# Patient Record
Sex: Female | Born: 1970 | Race: White | Hispanic: No | Marital: Married | Smoking: Former smoker
Health system: Southern US, Community
[De-identification: ages and names within clinical notes are randomized; demographics above are authoritative.]

## PROBLEM LIST (undated history)

## (undated) DIAGNOSIS — I82409 Acute embolism and thrombosis of unspecified deep veins of unspecified lower extremity: Secondary | ICD-10-CM

## (undated) HISTORY — DX: Acute embolism and thrombosis of unspecified deep veins of unspecified lower extremity: I82.409

---

## 1999-03-16 ENCOUNTER — Other Ambulatory Visit: Admission: RE | Admit: 1999-03-16 | Discharge: 1999-03-16 | Payer: Self-pay | Admitting: *Deleted

## 2000-04-09 ENCOUNTER — Other Ambulatory Visit: Admission: RE | Admit: 2000-04-09 | Discharge: 2000-04-09 | Payer: Self-pay | Admitting: Obstetrics and Gynecology

## 2001-04-07 ENCOUNTER — Other Ambulatory Visit: Admission: RE | Admit: 2001-04-07 | Discharge: 2001-04-07 | Payer: Self-pay | Admitting: Obstetrics and Gynecology

## 2002-02-23 ENCOUNTER — Observation Stay (HOSPITAL_COMMUNITY): Admission: AD | Admit: 2002-02-23 | Discharge: 2002-02-23 | Payer: Self-pay | Admitting: Obstetrics and Gynecology

## 2002-04-07 ENCOUNTER — Other Ambulatory Visit: Admission: RE | Admit: 2002-04-07 | Discharge: 2002-04-07 | Payer: Self-pay | Admitting: Obstetrics and Gynecology

## 2002-07-23 ENCOUNTER — Inpatient Hospital Stay (HOSPITAL_COMMUNITY): Admission: AD | Admit: 2002-07-23 | Discharge: 2002-07-25 | Payer: Self-pay | Admitting: Obstetrics and Gynecology

## 2002-08-19 ENCOUNTER — Encounter: Admission: RE | Admit: 2002-08-19 | Discharge: 2002-09-18 | Payer: Self-pay | Admitting: *Deleted

## 2004-11-23 ENCOUNTER — Other Ambulatory Visit: Admission: RE | Admit: 2004-11-23 | Discharge: 2004-11-23 | Payer: Self-pay | Admitting: Obstetrics and Gynecology

## 2009-11-14 ENCOUNTER — Ambulatory Visit (HOSPITAL_COMMUNITY): Admission: RE | Admit: 2009-11-14 | Discharge: 2009-11-14 | Payer: Self-pay | Admitting: Obstetrics and Gynecology

## 2010-10-27 NOTE — H&P (Signed)
Deborah Burton, Deborah Burton NO.:  [id number]   MEDICAL RECORD NO.:  [id number]                   PATIENT TYPE:  INP   LOCATION:  9174                                 FACILITY:  [facility]   PHYSICIAN:  Janine Limbo, M.D.            DATE OF BIRTH:  [birth year]   DATE OF ADMISSION:  07/23/2002  DATE OF DISCHARGE:                                HISTORY & PHYSICAL   HISTORY OF PRESENT ILLNESS:  This is a 40 year old, gravida 3, para 1-0-1-1  at 38-6/7 weeks who presents to the office in active labor.  She reports  contractions every five minutes all morning.  Denies leaking.  Reports small  amount of bloody show and positive fetal movement.  Pregnancy has been  followed by the nurse midwife service and remarkable for first trimester  bleeding and group B strep negative.   PRENATAL LABS:  Hemoglobin 12.5, platelets 187, blood type B positive,  antibody screen negative, RPR nonreactive, rubella immune, HBSAG negative,  HIV declined, Pap smear normal, gonorrhea negative, Chlamydia negative,  cystic fibrosis declined.  Second trimester - RPR nonreactive, AFP free beta  within normal limits.  Glucose tolerance within normal limits.  Third  trimester - group B strep negative, gonorrhea and Chlamydia negative.   OB HISTORY:  Remarkable for vaginal delivery in 1999 of a female infant at  [redacted] weeks gestation, weighing 6 pounds 9 ounces.  [place]  She was induced for post dates and there were no  complications.   PAST MEDICAL HISTORY:  Remarkable for history of anemia in prior pregnancy,  history of childhood varicella.   PAST SURGICAL HISTORY:  Remarkable for tonsillectomy and wisdom teeth  extraction.   FAMILY HISTORY:  Remarkable for mother with thrombophlebitis.  Paternal  grandfather with prostate cancer and a father with skin cancer.  Father with  nicotine addiction.  Genetic history is unremarkable.   SOCIAL HISTORY:  The  patient is married to [employer] who is involved and  supportive.  She just not report a religious affiliation.  Denies any  alcohol, tobacco or drug use.   PHYSICAL EXAMINATION:  VITAL SIGNS:  Stable, afebrile.  HEENT:  Within normal limits.  Thyroid normal, not enlarged.  CHEST:  Clear to auscultation.  HEART:  Regular rate and rhythm.  ABDOMEN:  Gravid at 37 cm, fetal heart rate 150. Cervical exam 4-5 cm, 90%  effaced, -1 station with bulging membranes.  EXTREMITIES:  Within normal limits.   ASSESSMENT:  1. Intrauterine pregnancy at 38-6/7 weeks.  2. Active labor.  3. Group B strep negative.    PLAN:  1. Admit to birthing suite.  ________ as notified.  2. Routine CNM orders.  3. Anticipate SVD.     Marie L. Williams, C.N.M.                 Janine Limbo, M.D.  MLW/MEDQ  D:  07/23/2002  T:  07/23/2002  Job:  [id number]

## 2014-08-10 DIAGNOSIS — S0101XA Laceration without foreign body of scalp, initial encounter: Secondary | ICD-10-CM | POA: Diagnosis not present

## 2014-08-10 DIAGNOSIS — Z3202 Encounter for pregnancy test, result negative: Secondary | ICD-10-CM | POA: Insufficient documentation

## 2014-08-10 DIAGNOSIS — W01198A Fall on same level from slipping, tripping and stumbling with subsequent striking against other object, initial encounter: Secondary | ICD-10-CM | POA: Insufficient documentation

## 2014-08-10 DIAGNOSIS — Z23 Encounter for immunization: Secondary | ICD-10-CM | POA: Insufficient documentation

## 2014-08-10 DIAGNOSIS — Y998 Other external cause status: Secondary | ICD-10-CM | POA: Diagnosis not present

## 2014-08-10 DIAGNOSIS — Y939 Activity, unspecified: Secondary | ICD-10-CM | POA: Diagnosis not present

## 2014-08-10 DIAGNOSIS — Y929 Unspecified place or not applicable: Secondary | ICD-10-CM | POA: Insufficient documentation

## 2014-08-10 DIAGNOSIS — R55 Syncope and collapse: Secondary | ICD-10-CM | POA: Diagnosis not present

## 2014-08-11 ENCOUNTER — Other Ambulatory Visit: Payer: Self-pay

## 2014-08-11 ENCOUNTER — Encounter (HOSPITAL_COMMUNITY): Payer: Self-pay | Admitting: Emergency Medicine

## 2014-08-11 ENCOUNTER — Emergency Department (HOSPITAL_COMMUNITY)
Admission: EM | Admit: 2014-08-11 | Discharge: 2014-08-11 | Disposition: A | Discharge status: Home / Self Care | Payer: BC Managed Care – PPO | Attending: Emergency Medicine | Admitting: Emergency Medicine

## 2014-08-11 ENCOUNTER — Emergency Department (HOSPITAL_COMMUNITY): Payer: BC Managed Care – PPO

## 2014-08-11 DIAGNOSIS — S0101XA Laceration without foreign body of scalp, initial encounter: Secondary | ICD-10-CM

## 2014-08-11 DIAGNOSIS — R55 Syncope and collapse: Secondary | ICD-10-CM

## 2014-08-11 LAB — PREGNANCY, URINE: Preg Test, Ur: NEGATIVE

## 2014-08-11 LAB — BASIC METABOLIC PANEL
Anion gap: 5 (ref 5–15)
BUN: 13 mg/dL (ref 6–23)
CALCIUM: 8.6 mg/dL (ref 8.4–10.5)
CO2: 26 mmol/L (ref 19–32)
CREATININE: 0.81 mg/dL (ref 0.50–1.10)
Chloride: 103 mmol/L (ref 96–112)
GFR calc Af Amer: >90 mL/min (ref >90)
GFR, EST NON AFRICAN AMERICAN: 86 mL/min — AB (ref >90)
GLUCOSE: 115 mg/dL — AB (ref 70–99)
POTASSIUM: 3.7 mmol/L (ref 3.5–5.1)
Sodium: 134 mmol/L — ABNORMAL LOW (ref 135–145)

## 2014-08-11 LAB — CBC WITH DIFFERENTIAL/PLATELET
BASOS ABS: 0 10*3/uL (ref 0.0–0.1)
BASOS PCT: 0 % (ref 0–1)
Eosinophils Absolute: 0.1 10*3/uL (ref 0.0–0.7)
Eosinophils Relative: 1 % (ref 0–5)
HCT: 36.9 % (ref 36.0–46.0)
HEMOGLOBIN: 12.6 g/dL (ref 12.0–15.0)
LYMPHS PCT: 10 % — AB (ref 12–46)
Lymphs Abs: 1.2 10*3/uL (ref 0.7–4.0)
MCH: 30.2 pg (ref 26.0–34.0)
MCHC: 34.1 g/dL (ref 30.0–36.0)
MCV: 88.5 fL (ref 78.0–100.0)
MONOS PCT: 6 % (ref 3–12)
Monocytes Absolute: 0.7 10*3/uL (ref 0.1–1.0)
NEUTROS ABS: 9.6 10*3/uL — AB (ref 1.7–7.7)
NEUTROS PCT: 83 % — AB (ref 43–77)
Platelets: 204 10*3/uL (ref 150–400)
RBC: 4.17 MIL/uL (ref 3.87–5.11)
RDW: 13.6 % (ref 11.5–15.5)
WBC: 11.6 10*3/uL — AB (ref 4.0–10.5)

## 2014-08-11 LAB — URINALYSIS, ROUTINE W REFLEX MICROSCOPIC
Bilirubin Urine: NEGATIVE
Glucose, UA: NEGATIVE mg/dL
Hgb urine dipstick: NEGATIVE
KETONES UR: NEGATIVE mg/dL
LEUKOCYTES UA: NEGATIVE
NITRITE: NEGATIVE
PROTEIN: NEGATIVE mg/dL
Specific Gravity, Urine: 1.013 (ref 1.005–1.030)
UROBILINOGEN UA: 0.2 mg/dL (ref 0.0–1.0)
pH: 5.5 (ref 5.0–8.0)

## 2014-08-11 MED ORDER — LIDOCAINE-EPINEPHRINE (PF) 2 %-1:200000 IJ SOLN
10.0000 mL | Freq: Once | INTRAMUSCULAR | Status: AC
  Administered 2014-08-11: 10 mL
  Filled 2014-08-11: qty 20

## 2014-08-11 MED ORDER — TETANUS-DIPHTH-ACELL PERTUSSIS 5-2.5-18.5 LF-MCG/0.5 IM SUSP
0.5000 mL | Freq: Once | INTRAMUSCULAR | Status: AC
  Administered 2014-08-11: 0.5 mL via INTRAMUSCULAR
  Filled 2014-08-11: qty 0.5

## 2014-08-11 NOTE — ED Notes (Signed)
Per Dr Sharman Cheektter's verbal order, do not insert IV at this time.

## 2014-08-11 NOTE — Discharge Instructions (Signed)
Staples can be removed in one week.  This can be done in the emergency department, urgent care or at your primary care doctor's office.  Your workup was otherwise unremarkable.  You received tetanus update today.   Laceration Care, Adult A laceration is a cut or lesion that goes through all layers of the skin and into the tissue just beneath the skin. TREATMENT  Some lacerations may not require closure. Some lacerations may not be able to be closed due to an increased risk of infection. It is important to see your caregiver as soon as possible after an injury to minimize the risk of infection and maximize the opportunity for successful closure. If closure is appropriate, pain medicines may be given, if needed. The wound will be cleaned to help prevent infection. Your caregiver will use stitches (sutures), staples, wound glue (adhesive), or skin adhesive strips to repair the laceration. These tools bring the skin edges together to allow for faster healing and a better cosmetic outcome. However, all wounds will heal with a scar. Once the wound has healed, scarring can be minimized by covering the wound with sunscreen during the day for 1 full year. HOME CARE INSTRUCTIONS  For sutures or staples:  Keep the wound clean and dry.  If you were given a bandage (dressing), you should change it at least once a day. Also, change the dressing if it becomes wet or dirty, or as directed by your caregiver.  Wash the wound with soap and water 2 times a day. Rinse the wound off with water to remove all soap. Pat the wound dry with a clean towel.  After cleaning, apply a thin layer of the antibiotic ointment as recommended by your caregiver. This will help prevent infection and keep the dressing from sticking.  You may shower as usual after the first 24 hours. Do not soak the wound in water until the sutures are removed.  Only take over-the-counter or prescription medicines for pain, discomfort, or fever as  directed by your caregiver.  Get your sutures or staples removed as directed by your caregiver. For skin adhesive strips:  Keep the wound clean and dry.  Do not get the skin adhesive strips wet. You may bathe carefully, using caution to keep the wound dry.  If the wound gets wet, pat it dry with a clean towel.  Skin adhesive strips will fall off on their own. You may trim the strips as the wound heals. Do not remove skin adhesive strips that are still stuck to the wound. They will fall off in time. For wound adhesive:  You may briefly wet your wound in the shower or bath. Do not soak or scrub the wound. Do not swim. Avoid periods of heavy perspiration until the skin adhesive has fallen off on its own. After showering or bathing, gently pat the wound dry with a clean towel.  Do not apply liquid medicine, cream medicine, or ointment medicine to your wound while the skin adhesive is in place. This may loosen the film before your wound is healed.  If a dressing is placed over the wound, be careful not to apply tape directly over the skin adhesive. This may cause the adhesive to be pulled off before the wound is healed.  Avoid prolonged exposure to sunlight or tanning lamps while the skin adhesive is in place. Exposure to ultraviolet light in the first year will darken the scar.  The skin adhesive will usually remain in place for 5 to 10  days, then naturally fall off the skin. Do not pick at the adhesive film. You may need a tetanus shot if:  You cannot remember when you had your last tetanus shot.  You have never had a tetanus shot. If you get a tetanus shot, your arm may swell, get red, and feel warm to the touch. This is common and not a problem. If you need a tetanus shot and you choose not to have one, there is a rare chance of getting tetanus. Sickness from tetanus can be serious. SEEK MEDICAL CARE IF:   You have redness, swelling, or increasing pain in the wound.  You see a red  line that goes away from the wound.  You have yellowish-white fluid (pus) coming from the wound.  You have a fever.  You notice a bad smell coming from the wound or dressing.  Your wound breaks open before or after sutures have been removed.  You notice something coming out of the wound such as wood or glass.  Your wound is on your hand or foot and you cannot move a finger or toe. SEEK IMMEDIATE MEDICAL CARE IF:   Your pain is not controlled with prescribed medicine.  You have severe swelling around the wound causing pain and numbness or a change in color in your arm, hand, leg, or foot.  Your wound splits open and starts bleeding.  You have worsening numbness, weakness, or loss of function of any joint around or beyond the wound.  You develop painful lumps near the wound or on the skin anywhere on your body. MAKE SURE YOU:   Understand these instructions.  Will watch your condition.  Will get help right away if you are not doing well or get worse. Document Released: 05/28/2005 Document Revised: 08/20/2011 Document Reviewed: 11/21/2010 [facility] Patient Information 2015 [place], [place]. This information is not intended to replace advice given to you by your health care provider. Make sure you discuss any questions you have with your health care provider.  Stitches, Staples, or Skin Adhesive Strips  Stitches (sutures), staples, and skin adhesive strips hold the skin together as it heals. They will usually be in place for 7 days or less. HOME CARE  Wash your hands with soap and water before and after you touch your wound.  Only take medicine as told by your doctor.  Cover your wound only if your doctor told you to. Otherwise, leave it open to air.  Do not get your stitches wet or dirty. If they get dirty, dab them gently with a clean washcloth. Wet the washcloth with soapy water. Do not rub. Pat them dry gently.  Do not put medicine or medicated cream on your stitches  unless your doctor told you to.  Do not take out your own stitches or staples. Skin adhesive strips will fall off by themselves.  Do not pick at the wound. Picking can cause an infection.  Do not miss your follow-up appointment.  If you have problems or questions, call your doctor. GET HELP RIGHT AWAY IF:   You have a temperature by mouth above 102 F (38.9 C), not controlled by medicine.  You have chills.  You have redness or pain around your stitches.  There is puffiness (swelling) around your stitches.  You notice fluid (drainage) from your stitches.  There is a bad smell coming from your wound. MAKE SURE YOU:  Understand these instructions.  Will watch your condition.  Will get help if you are not doing  well or get worse. Document Released: 03/25/2009 Document Revised: 08/20/2011 Document Reviewed: 03/25/2009 [facility] Patient Information 2015 [place]. This information is not intended to replace advice given to you by your health care provider. Make sure you discuss any questions you have with your health care provider.  Syncope Syncope is a medical term for fainting or passing out. This means you lose consciousness and drop to the ground. People are generally unconscious for less than 5 minutes. You may have some muscle twitches for up to 15 seconds before waking up and returning to normal. Syncope occurs more often in older adults, but it can happen to anyone. While most causes of syncope are not dangerous, syncope can be a sign of a serious medical problem. It is important to seek medical care.  CAUSES  Syncope is caused by a sudden drop in blood flow to the brain. The specific cause is often not determined. Factors that can bring on syncope include:  Taking medicines that lower blood pressure.  Sudden changes in posture, such as standing up quickly.  Taking more medicine than prescribed.  Standing in one place for too long.  Seizure  disorders.  Dehydration and excessive exposure to heat.  Low blood sugar (hypoglycemia).  Straining to have a bowel movement.  Heart disease, irregular heartbeat, or other circulatory problems.  Fear, emotional distress, seeing blood, or severe pain. SYMPTOMS  Right before fainting, you may:  Feel dizzy or light-headed.  Feel nauseous.  See all white or all black in your field of vision.  Have cold, clammy skin. DIAGNOSIS  Your health care provider will ask about your symptoms, perform a physical exam, and perform an electrocardiogram (ECG) to record the electrical activity of your heart. Your health care provider may also perform other heart or blood tests to determine the cause of your syncope which may include:  Transthoracic echocardiogram (TTE). During echocardiography, sound waves are used to evaluate how blood flows through your heart.  Transesophageal echocardiogram (TEE).  Cardiac monitoring. This allows your health care provider to monitor your heart rate and rhythm in real time.  Holter monitor. This is a portable device that records your heartbeat and can help diagnose heart arrhythmias. It allows your health care provider to track your heart activity for several days, if needed.  Stress tests by exercise or by giving medicine that makes the heart beat faster. TREATMENT  In most cases, no treatment is needed. Depending on the cause of your syncope, your health care provider may recommend changing or stopping some of your medicines. HOME CARE INSTRUCTIONS  Have someone stay with you until you feel stable.  Do not drive, use machinery, or play sports until your health care provider says it is okay.  Keep all follow-up appointments as directed by your health care provider.  Lie down right away if you start feeling like you might faint. Breathe deeply and steadily. Wait until all the symptoms have passed.  Drink enough fluids to keep your urine clear or pale  yellow.  If you are taking blood pressure or heart medicine, get up slowly and take several minutes to sit and then stand. This can reduce dizziness. SEEK IMMEDIATE MEDICAL CARE IF:   You have a severe headache.  You have unusual pain in the chest, abdomen, or back.  You are bleeding from your mouth or rectum, or you have black or tarry stool.  You have an irregular or very fast heartbeat.  You have pain with breathing.  You  have repeated fainting or seizure-like jerking during an episode.  You faint when sitting or lying down.  You have confusion.  You have trouble walking.  You have severe weakness.  You have vision problems. If you fainted, call your local emergency services (911 in [place] Do not drive yourself to the hospital.  MAKE SURE YOU:  Understand these instructions.  Will watch your condition.  Will get help right away if you are not doing well or get worse. Document Released: 05/28/2005 Document Revised: 06/02/2013 Document Reviewed: 07/27/2011 [facility] Patient Information 2015 [place], [place]. This information is not intended to replace advice given to you by your health care provider. Make sure you discuss any questions you have with your health care provider.

## 2014-08-11 NOTE — ED Notes (Signed)
Suture cart at bedside along with stapler

## 2014-08-11 NOTE — ED Notes (Signed)
Pt st's she had a syncopal episode and fell hitting her head on fireplace.  Pt has lac to back of head, bleeding controlled at this time.  Pt also c/o pain in left shoulder and left ankle.  Pt alert and oriented x's 3 at this time.  Also c/o pain in neck.  C-collar applied in triage

## 2014-08-11 NOTE — ED Provider Notes (Signed)
CSN: [id number]     Arrival date & time 08/10/14  2339 History  This chart was scribed for Olivia Mackie, MD by Annye Asa, ED Scribe. This patient was seen in room A04C/A04C and the patient's care was started at 1:26 AM.    Chief Complaint  Patient presents with  . Head Laceration   Patient is a 44 y.o. female presenting with scalp laceration. The history is provided by the patient. No language interpreter was used.  Head Laceration     HPI Comments: Deborah Burton is an otherwise healthy 44 y.o. female who presents to the Emergency Department complaining of head laceration after a syncopal episode and fall just PTA tonight. Patient explains that she woke from sleep on the couch and stood to travel to her bedroom; she accidentally "rolled" her left ankle and fell to the floor. When she tried to stand to continue to her bedroom, she momentarily lost consciousness, falling and striking her head against the fireplace. No one heard her fall; both her children were asleep. She does note prior experience with similar syncopal episodes.The mild discomfort in her left ankle, left shoulder, and neck has resolved at this time.   Currently menstruating. Patient is a nonsmoker, occasional EtOH user.   History reviewed. No pertinent past medical history. History reviewed. No pertinent past surgical history. No family history on file. History  Substance Use Topics  . Smoking status: Never Smoker   . Smokeless tobacco: Not on file  . Alcohol Use: Yes     Comment: rare   OB History    No data available     Review of Systems  Skin: Positive for wound.  Neurological: Positive for syncope.  All other systems reviewed and are negative.  Allergies  Review of patient's allergies indicates not on file.  Home Medications   Prior to Admission medications   Not on File   BP 118/78 mmHg  Pulse 87  Temp(Src) 98.3 F (36.8 C) (Oral)  Resp 20  Ht  (1.6 m)  Wt 145 lb (65.772 kg)  BMI  25.69 kg/m2  SpO2 100%  LMP 08/10/2014 Physical Exam  Constitutional: She is oriented to person, place, and time. She appears well-developed and well-nourished. No distress.  HENT:  Head: Normocephalic and atraumatic.  Right Ear: External ear normal.  Left Ear: External ear normal.  Nose: Nose normal.  Mouth/Throat: Oropharynx is clear and moist. No oropharyngeal exudate.  Eyes: EOM are normal. Pupils are equal, round, and reactive to light.  Neck: Normal range of motion. Neck supple.  Cardiovascular: Normal rate, regular rhythm, normal heart sounds and intact distal pulses.  Exam reveals no gallop and no friction rub.   No murmur heard. Pulmonary/Chest: Effort normal and breath sounds normal. No respiratory distress. She has no wheezes. She has no rales. She exhibits no tenderness.  Abdominal: Soft. There is no tenderness. There is no rebound and no guarding.  Musculoskeletal: Normal range of motion. She exhibits no edema.  Neurological: She is alert and oriented to person, place, and time. She displays normal reflexes. No cranial nerve deficit. She exhibits normal muscle tone. Coordination normal.  Skin: Skin is warm and dry. No rash noted. No pallor.  3cm linear laceration to occiput; bleeding controlled  Psychiatric: She has a normal mood and affect. Her behavior is normal. Thought content normal.  Nursing note and vitals reviewed.  ED Course  Procedures   DIAGNOSTIC STUDIES: Oxygen Saturation is 100% on RA, normal by my  interpretation.    COORDINATION OF CARE: 1:29 AM Discussed treatment plan with pt at bedside and pt agreed to plan.   Labs Review Labs Reviewed  BASIC METABOLIC PANEL - Abnormal; Notable for the following:    Sodium 134 (*)    Glucose, Bld 115 (*)    GFR calc non Af Amer 86 (*)    All other components within normal limits  CBC WITH DIFFERENTIAL/PLATELET - Abnormal; Notable for the following:    WBC 11.6 (*)    Neutrophils Relative % 83 (*)    Neutro  Abs 9.6 (*)    Lymphocytes Relative 10 (*)    All other components within normal limits  URINALYSIS, ROUTINE W REFLEX MICROSCOPIC  PREGNANCY, URINE    Imaging Review Ct Head Wo Contrast  08/11/2014   CLINICAL DATA:  Multiple syncopal episodes. Hit back of head on fireplace in the living room. Laceration at the back of the head. Posterior neck pain and soreness. Initial encounter.  EXAM: CT HEAD WITHOUT CONTRAST  CT CERVICAL SPINE WITHOUT CONTRAST  TECHNIQUE: Multidetector CT imaging of the head and cervical spine was performed following the standard protocol without intravenous contrast. Multiplanar CT image reconstructions of the cervical spine were also generated.  COMPARISON:  None.  FINDINGS: CT HEAD FINDINGS  There is no evidence of acute infarction, mass lesion, or intra- or extra-axial hemorrhage on CT.  The posterior fossa, including the cerebellum, brainstem and fourth ventricle, is within normal limits. The third and lateral ventricles, and basal ganglia are unremarkable in appearance. The cerebral hemispheres are symmetric in appearance, with normal gray-white differentiation. No mass effect or midline shift is seen.  There is no evidence of fracture; visualized osseous structures are unremarkable in appearance. The visualized portions of the orbits are within normal limits. The paranasal sinuses and mastoid air cells are well-aerated. No significant soft tissue abnormalities are seen.  CT CERVICAL SPINE FINDINGS  There is no evidence of fracture or subluxation. Vertebral bodies demonstrate normal height and alignment. There is mild disc space narrowing at C5-C6, with a small posterior disc osteophyte complex. Prevertebral soft tissues are within normal limits.  The thyroid gland is unremarkable in appearance. The visualized lung apices are clear. No significant soft tissue abnormalities are seen.  IMPRESSION: 1. No evidence of traumatic intracranial injury or fracture. 2. No evidence of fracture  or subluxation along the cervical spine. 3. Minimal degenerative change at the lower cervical spine.   Electronically Signed   By: Roanna Raider M.D.   On: 08/11/2014 03:02   Ct Cervical Spine Wo Contrast  08/11/2014   CLINICAL DATA:  Multiple syncopal episodes. Hit back of head on fireplace in the living room. Laceration at the back of the head. Posterior neck pain and soreness. Initial encounter.  EXAM: CT HEAD WITHOUT CONTRAST  CT CERVICAL SPINE WITHOUT CONTRAST  TECHNIQUE: Multidetector CT imaging of the head and cervical spine was performed following the standard protocol without intravenous contrast. Multiplanar CT image reconstructions of the cervical spine were also generated.  COMPARISON:  None.  FINDINGS: CT HEAD FINDINGS  There is no evidence of acute infarction, mass lesion, or intra- or extra-axial hemorrhage on CT.  The posterior fossa, including the cerebellum, brainstem and fourth ventricle, is within normal limits. The third and lateral ventricles, and basal ganglia are unremarkable in appearance. The cerebral hemispheres are symmetric in appearance, with normal gray-white differentiation. No mass effect or midline shift is seen.  There is no evidence of fracture; visualized  osseous structures are unremarkable in appearance. The visualized portions of the orbits are within normal limits. The paranasal sinuses and mastoid air cells are well-aerated. No significant soft tissue abnormalities are seen.  CT CERVICAL SPINE FINDINGS  There is no evidence of fracture or subluxation. Vertebral bodies demonstrate normal height and alignment. There is mild disc space narrowing at C5-C6, with a small posterior disc osteophyte complex. Prevertebral soft tissues are within normal limits.  The thyroid gland is unremarkable in appearance. The visualized lung apices are clear. No significant soft tissue abnormalities are seen.  IMPRESSION: 1. No evidence of traumatic intracranial injury or fracture. 2. No  evidence of fracture or subluxation along the cervical spine. 3. Minimal degenerative change at the lower cervical spine.   Electronically Signed   By: Roanna RaiderJeffery  Chang M.D.   On: 08/11/2014 03:02     EKG Interpretation   Date/Time:  Wednesday August 11 2014 00:15:33 EST Ventricular Rate:  78 PR Interval:  142 QRS Duration: 82 QT Interval:  394 QTC Calculation: 449 R Axis:   57 Text Interpretation:  Normal sinus rhythm Normal ECG Confirmed by Janai Brannigan   MD, Siriyah Ambrosius ([id number]) on 08/11/2014 12:32:20 AM     LACERATION REPAIR Performed by: Olivia MackieTTER,Jesalyn Finazzo M Authorized by: Olivia MackieTTER,Tate Zagal M Consent: Verbal consent obtained. Risks and benefits: risks, benefits and alternatives were discussed Consent given by: patient Patient identity confirmed: provided demographic data Prepped and Draped in normal sterile fashion Wound explored  Laceration Location: posterior scalp  Laceration Length: 3cm  No Foreign Bodies seen or palpated  Anesthesia: local infiltration  Local anesthetic: lidocaine 2% with epinephrine  Anesthetic total: 3 ml  Irrigation method: syringe Amount of cleaning: standard  Skin closure: skin staples  Number of sutures: 4  Technique: surgical staples  Patient tolerance: Patient tolerated the procedure well with no immediate complications.  MDM   Final diagnoses:  Syncope, unspecified syncope type  Scalp laceration, initial encounter   44 year old female with syncope, fall, with scalp laceration.  Normal exam.  She reports history of syncope associated with pain going back to high school.  Plan for head and C-spine CT scan.  EKG normal.  Labs pending.  I personally performed the services described in this documentation, which was scribed in my presence. The recorded information has been reviewed and is accurate.  Workup here unremarkable.  Laceration repaired.  Patient stable for discharge home      Olivia Mackielga M Abdon Petrosky, MD 08/11/14 [phone]

## 2015-02-09 ENCOUNTER — Other Ambulatory Visit: Payer: Self-pay | Admitting: Obstetrics and Gynecology

## 2015-02-09 DIAGNOSIS — R928 Other abnormal and inconclusive findings on diagnostic imaging of breast: Secondary | ICD-10-CM

## 2019-04-01 HISTORY — PX: KNEE ARTHROSCOPY: SUR90

## 2019-04-07 ENCOUNTER — Other Ambulatory Visit: Payer: Self-pay

## 2019-04-07 ENCOUNTER — Other Ambulatory Visit (HOSPITAL_COMMUNITY): Payer: Self-pay | Admitting: Orthopedic Surgery

## 2019-04-07 ENCOUNTER — Ambulatory Visit (HOSPITAL_COMMUNITY)
Admission: RE | Admit: 2019-04-07 | Discharge: 2019-04-07 | Disposition: A | Discharge status: Home / Self Care | Payer: BC Managed Care – PPO | Source: Ambulatory Visit | Attending: Cardiovascular Disease | Admitting: Cardiovascular Disease

## 2019-04-07 ENCOUNTER — Encounter (HOSPITAL_COMMUNITY): Payer: Self-pay

## 2019-04-07 DIAGNOSIS — M79661 Pain in right lower leg: Secondary | ICD-10-CM | POA: Diagnosis not present

## 2019-04-07 DIAGNOSIS — M7989 Other specified soft tissue disorders: Secondary | ICD-10-CM

## 2019-04-07 NOTE — Progress Notes (Unsigned)
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  Preliminary results can be found under CV proc through chart review.  Wilkie Aye RVT [facility]

## 2019-06-10 ENCOUNTER — Telehealth: Payer: Self-pay | Admitting: Hematology and Oncology

## 2019-06-10 NOTE — Telephone Encounter (Signed)
Received a new hem referral from Dr. Ronita Hipps for dx of DVT. Mrs. Deborah Burton has been cld and scheduled to see Dr. Lorenso Courier on 1/4 at 9am. Pt aware to arrive 15 minutes early

## 2019-06-15 ENCOUNTER — Inpatient Hospital Stay: Payer: BC Managed Care – PPO

## 2019-06-15 ENCOUNTER — Inpatient Hospital Stay: Payer: BC Managed Care – PPO | Attending: Hematology and Oncology | Admitting: Hematology and Oncology

## 2019-06-15 ENCOUNTER — Other Ambulatory Visit: Payer: Self-pay

## 2019-06-15 VITALS — BP 123/81 | HR 86 | Temp 98.0°F | Resp 16 | Ht 63.0 in | Wt 166.8 lb

## 2019-06-15 DIAGNOSIS — Z803 Family history of malignant neoplasm of breast: Secondary | ICD-10-CM | POA: Insufficient documentation

## 2019-06-15 DIAGNOSIS — Z7901 Long term (current) use of anticoagulants: Secondary | ICD-10-CM | POA: Insufficient documentation

## 2019-06-15 DIAGNOSIS — I82461 Acute embolism and thrombosis of right calf muscular vein: Secondary | ICD-10-CM | POA: Insufficient documentation

## 2019-06-15 DIAGNOSIS — Z87891 Personal history of nicotine dependence: Secondary | ICD-10-CM | POA: Insufficient documentation

## 2019-06-15 DIAGNOSIS — Z8042 Family history of malignant neoplasm of prostate: Secondary | ICD-10-CM | POA: Insufficient documentation

## 2019-06-15 DIAGNOSIS — Z23 Encounter for immunization: Secondary | ICD-10-CM | POA: Insufficient documentation

## 2019-06-15 LAB — CBC WITH DIFFERENTIAL (CANCER CENTER ONLY)
Abs Immature Granulocytes: 0.01 10*3/uL (ref 0.00–0.07)
Basophils Absolute: 0 10*3/uL (ref 0.0–0.1)
Basophils Relative: 0 %
Eosinophils Absolute: 0.1 10*3/uL (ref 0.0–0.5)
Eosinophils Relative: 1 %
HCT: 41.6 % (ref 36.0–46.0)
Hemoglobin: 14 g/dL (ref 12.0–15.0)
Immature Granulocytes: 0 %
Lymphocytes Relative: 25 %
Lymphs Abs: 1.7 10*3/uL (ref 0.7–4.0)
MCH: 29.7 pg (ref 26.0–34.0)
MCHC: 33.7 g/dL (ref 30.0–36.0)
MCV: 88.1 fL (ref 80.0–100.0)
Monocytes Absolute: 0.5 10*3/uL (ref 0.1–1.0)
Monocytes Relative: 8 %
Neutro Abs: 4.5 10*3/uL (ref 1.7–7.7)
Neutrophils Relative %: 66 %
Platelet Count: 227 10*3/uL (ref 150–400)
RBC: 4.72 MIL/uL (ref 3.87–5.11)
RDW: 13.5 % (ref 11.5–15.5)
WBC Count: 6.8 10*3/uL (ref 4.0–10.5)
nRBC: 0 % (ref 0.0–0.2)

## 2019-06-15 LAB — CMP (CANCER CENTER ONLY)
ALT: 21 U/L (ref 0–44)
AST: 18 U/L (ref 15–41)
Albumin: 4.3 g/dL (ref 3.5–5.0)
Alkaline Phosphatase: 75 U/L (ref 38–126)
Anion gap: 11 (ref 5–15)
BUN: 10 mg/dL (ref 6–20)
CO2: 24 mmol/L (ref 22–32)
Calcium: 9.1 mg/dL (ref 8.9–10.3)
Chloride: 105 mmol/L (ref 98–111)
Creatinine: 0.79 mg/dL (ref 0.44–1.00)
GFR, Est AFR Am: >60 mL/min (ref >60)
GFR, Estimated: >60 mL/min (ref >60)
Glucose, Bld: 97 mg/dL (ref 70–99)
Potassium: 4.6 mmol/L (ref 3.5–5.1)
Sodium: 140 mmol/L (ref 135–145)
Total Bilirubin: 0.6 mg/dL (ref 0.3–1.2)
Total Protein: 7.4 g/dL (ref 6.5–8.1)

## 2019-06-15 MED ORDER — INFLUENZA VAC SPLIT QUAD 0.5 ML IM SUSY
0.5000 mL | PREFILLED_SYRINGE | Freq: Once | INTRAMUSCULAR | Status: AC
  Administered 2019-06-15: 0.5 mL via INTRAMUSCULAR

## 2019-06-15 MED ORDER — RIVAROXABAN 20 MG PO TABS: 20.0000 mg | ORAL_TABLET | Freq: Every day | ORAL | 3 refills | Status: AC

## 2019-06-15 MED ORDER — INFLUENZA VAC SPLIT QUAD 0.5 ML IM SUSY
PREFILLED_SYRINGE | INTRAMUSCULAR | Status: AC
  Filled 2019-06-15: qty 0.5

## 2019-06-15 NOTE — Progress Notes (Signed)
[facility] [phone]   Fax:(336) [phone]  INITIAL CONSULT NOTE  Patient Care Team: Patient, No Pcp Per as PCP - General (General Practice)  Hematological/Oncological History  #Right Lower Extremity DVT, Provoked 1) 04/01/2019: right knee surgery with repair of right meniscus tear 2) 04/07/2019: post operative f/u found to have leg swelling. LE doppler revealed acute deep vein thrombosis involving the right peroneal veins. Started on Xarelto therapy 3) 06/15/2019: established care with Dr. Leonides Schanz   CHIEF COMPLAINTS/PURPOSE OF CONSULTATION:  Right Lower Extremity DVT, provoked.   HISTORY OF PRESENTING ILLNESS:  Deborah Burton 49 y.o. female with medical history significant for RLE DVT who presents for evaluation and management of her blood clot.   On review of the prior records the patient underwent a right knee arthroscopy for repair of a right meniscus tear in Oct 2020. Following the procedure she had began having swelling of the right lower extremity. On a follow-up visit 1 week later she underwent a lower extremity Doppler which revealed acute deep vein thrombosis involving the right peroneal veins. She was started on Xarelto 15 mg twice daily x21 days followed by 20 mg daily. The patient was seen by her OB/GYN in December at which time she was referred to hematology for further evaluation and management of her blood clot.  On exam today [name] notes that she continues to have tightness in the right calf. She reports that there is no erythema or overlying swelling. She reports that she has been taking her Xarelto as prescribed 20 mg, however she did transition from a morning dose to a night dose without an interval dose. She made this transition approximately 2 to 3 weeks ago. She notes that she has had some mild bleeding when blowing her nose, but otherwise has had no overt bleeding. She reports that she no longer has any menstrual cycles and denies  having any dark stools. She also has not had any bruising. She notes that occasionally she does have a sharper pain in her right lower extremity. She denies any chest pain, shortness of breath, vision changes, or headache. Of note her cancer screenings are up-to-date with her mammogram performed in December and her Cologuard cards pending. Full 10 point ROS is listed below.  MEDICAL HISTORY:  Past Medical History:  Diagnosis Date   DVT (deep venous thrombosis) (HCC) 04/06/2020    SURGICAL HISTORY: Past Surgical History:  Procedure Laterality Date   KNEE ARTHROSCOPY Right 04/01/2019    SOCIAL HISTORY: Social History   Socioeconomic History   Marital status: Married    Spouse name: Not on file   Number of children: Not on file   Years of education: Not on file   Highest education level: Not on file  Occupational History   Not on file  Tobacco Use   Smoking status: Former Smoker    Packs/day: 1.00    Years: 8.00    Pack years: 8.00    Types: Cigarettes  Substance and Sexual Activity   Alcohol use: Yes    Comment: rare   Drug use: No   Sexual activity: Not on file  Other Topics Concern   Not on file  Social History Narrative   Not on file   Social Determinants of Health   Financial Resource Strain:    Difficulty of Paying Living Expenses: Not on file  Food Insecurity:    Worried About Running Out of Food in the Last Year: Not on file   Ran  Out of Food in the Last Year: Not on file  Transportation Needs:    Lack of Transportation (Medical): Not on file   Lack of Transportation (Non-Medical): Not on file  Physical Activity:    Days of Exercise per Week: Not on file   Minutes of Exercise per Session: Not on file  Stress:    Feeling of Stress : Not on file  Social Connections:    Frequency of Communication with Friends and Family: Not on file   Frequency of Social Gatherings with Friends and Family: Not on file   Attends Religious Services:  Not on file   Active Member of Clubs or Organizations: Not on file   Attends Banker Meetings: Not on file   Marital Status: Not on file  Intimate Partner Violence:    Fear of Current or Ex-Partner: Not on file   Emotionally Abused: Not on file   Physically Abused: Not on file   Sexually Abused: Not on file    FAMILY HISTORY: Family History  Problem Relation Age of Onset   Deep vein thrombosis Mother    Alzheimer's disease Father    Breast cancer Paternal Aunt    Prostate cancer Paternal Grandfather     ALLERGIES:  has No Known Allergies.  MEDICATIONS:  Current Outpatient Medications  Medication Sig Dispense Refill   rivaroxaban (XARELTO) 20 MG TABS tablet Take 1 tablet (20 mg total) by mouth daily with supper. 30 tablet 3   No current facility-administered medications for this visit.    REVIEW OF SYSTEMS:   Constitutional: ( - ) fevers, ( - )  chills , ( - ) night sweats Eyes: ( - ) blurriness of vision, ( - ) double vision, ( - ) watery eyes Ears, nose, mouth, throat, and face: ( - ) mucositis, ( - ) sore throat Respiratory: ( - ) cough, ( - ) dyspnea, ( - ) wheezes Cardiovascular: ( - ) palpitation, ( - ) chest discomfort, ( - ) lower extremity swelling Gastrointestinal:  ( - ) nausea, ( - ) heartburn, ( - ) change in bowel habits Skin: ( - ) abnormal skin rashes Lymphatics: ( - ) new lymphadenopathy, ( - ) easy bruising Neurological: ( - ) numbness, ( - ) tingling, ( - ) new weaknesses Behavioral/Psych: ( - ) mood change, ( - ) new changes  All other systems were reviewed with the patient and are negative.  PHYSICAL EXAMINATION: ECOG PERFORMANCE STATUS: 1 - Symptomatic but completely ambulatory  Vitals:   06/15/19 0909  BP: 123/81  Pulse: 86  Resp: 16  Temp: 98 F (36.7 C)  SpO2: 100%   Filed Weights   06/15/19 0909  Weight: 166 lb 12.8 oz (75.7 kg)    GENERAL: well appearing middle aged Caucasian female in NAD  SKIN: skin  color, texture, turgor are normal, no rashes or significant lesions EYES: conjunctiva are pink and non-injected, sclera clear LUNGS: clear to auscultation and percussion with normal breathing effort HEART: regular rate & rhythm and no murmurs and no lower extremity edema ABDOMEN: soft, non-tender, non-distended, normal bowel sounds Musculoskeletal: no cyanosis of digits and no clubbing  PSYCH: alert & oriented x 3, fluent speech NEURO: no focal motor/sensory deficits  LABORATORY DATA:  I have reviewed the data as listed Lab Results  Component Value Date   WBC 6.8 06/15/2019   HGB 14.0 06/15/2019   HCT 41.6 06/15/2019   MCV 88.1 06/15/2019   PLT 227 06/15/2019  NEUTROABS 4.5 06/15/2019    PATHOLOGY: None to review  RADIOGRAPHIC STUDIES: None to review.  ASSESSMENT & PLAN EMBERLI BALLESTER 49 y.o. female with medical history significant for RLE DVT who presents for evaluation and management of her blood clot. After discussion with the patient review the labs her findings are most consistent with a right lower extremity DVT. At this time I do believe she is on appropriate therapy with Xarelto 20 mg daily after an appropriate ramp-up. The continued pain in her lower extremity is likely secondary to residual clot versus post thrombotic syndrome. As such I would recommend continuing Xarelto therapy at 20 mg daily at this time. I do not see a clear indication for repeat ultrasound given that her lower extremity is without erythema or continued swelling. If the pain were to increase further we could consider repeat imaging, but I do not think it is clearly indicated at this time.  Typical recommendations for a provoked DVT is anticoagulation therapy for 3 to 6 months. Given that she is having continuing pain nearly 2 months after starting therapy I would recommend a longer duration and continuation for 6 months. I will have her return to clinic in Apr 2021 at which time we will determine if  further anticoagulation is required. If the pain continues at that time it is likely that she has a post thrombotic syndrome and we would need to begin supportive therapy for her symptoms at that time. Otherwise she is tolerating therapy without any signs of bleeding and therefore can continue. Strict return precautions for new erythema, swelling, or increased pain.   #Right Lower Extremity DVT, provoked (post surgical) --continue Xarelto 20mg  daily for a full course of at least 6 months. 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 Treatment of choice would be continued exercise, NSAID therapy, and consideration of a compression stocking. --no clear indication for repeat imaging at this time. Lower extremity is without swelling or overlying erythema. If pain worsens we can consider repeat US --RTC in April 2021 to determine if therapy can be stopped at that time.   Orders Placed This Encounter  Procedures   CBC with Differential ([facility] Only)    Standing Status:   Future    Number of Occurrences:   1    Standing Expiration Date:   06/14/2020   CMP ([facility] only)    Standing Status:   Future    Number of Occurrences:   1    Standing Expiration Date:   06/14/2020    All questions were answered. The patient knows to call the clinic with any problems, questions or concerns.  A total of more than 60 minutes were spent on this encounter and over half of that time was spent on counseling and coordination of care as outlined above.   Ledell Peoples, MD Department of Hematology/Oncology [place] [facility] Phone: [phone] Pager: [phone] Email: [email or web address]  06/16/2019 7:32 PM

## 2019-06-16 ENCOUNTER — Encounter: Payer: Self-pay | Admitting: Hematology and Oncology

## 2019-06-17 ENCOUNTER — Telehealth: Payer: Self-pay | Admitting: Hematology and Oncology

## 2019-06-17 NOTE — Telephone Encounter (Signed)
Scheduled per los. Called and left msg. Mailed printout  °

## 2019-09-14 ENCOUNTER — Telehealth: Payer: Self-pay | Admitting: Hematology and Oncology

## 2019-09-14 ENCOUNTER — Inpatient Hospital Stay: Payer: BC Managed Care – PPO | Admitting: Hematology and Oncology

## 2019-09-14 ENCOUNTER — Inpatient Hospital Stay: Payer: BC Managed Care – PPO

## 2019-09-14 NOTE — Telephone Encounter (Signed)
R/s 4/5 appt due to Provider Pal. Called and left msg for patient

## 2019-09-24 ENCOUNTER — Inpatient Hospital Stay: Payer: BC Managed Care – PPO | Admitting: Hematology and Oncology

## 2019-09-24 ENCOUNTER — Inpatient Hospital Stay: Payer: BC Managed Care – PPO

## 2019-09-27 ENCOUNTER — Other Ambulatory Visit: Payer: Self-pay | Admitting: Hematology and Oncology

## 2019-09-27 DIAGNOSIS — I82461 Acute embolism and thrombosis of right calf muscular vein: Secondary | ICD-10-CM

## 2019-09-27 NOTE — Progress Notes (Signed)
[place] [phone]   Fax:(336) [phone]  PROGRESS NOTE  Patient Care Team: Patient, No Pcp Per as PCP - General (General Practice)  Hematological/Oncological History #Right Lower Extremity DVT, Provoked 1) 04/01/2019: right knee surgery with repair of right meniscus tear 2) 04/07/2019: post operative f/u found to have leg swelling. LE doppler revealed acute deep vein thrombosis involving the right peroneal veins. Started on Xarelto therapy 3) 06/15/2019: established care with Dr. Lorenso Courier  4) 09/28/2019: patient re-examined. Recommended d/c Xarelto therapy (completed 6 months total anticoagulation).   Interval History:  Deborah Burton 49 y.o. female with medical history significant for right lower extremity DVT presents for a follow up visit. The patient's last visit was on 06/15/2019 at which time she established care. In the interim since the last visit the patient has had no major changes in her health.   On exam today Deborah Burton notes that there has not been much change in her health since her last visit approximate 3 months ago.  She reports that her right lower extremity still continues to have a tightness feeling in the calf, but that there has been no increase in swelling or change in the color of the leg.  She notes that otherwise she is improved from when the DVT initially developed, but there has been no change in the last 3 months.  On further discussion the patient reports that she would like to get off the Xarelto therapy.  She reports that she had a heavy menstrual period at the end of March that lasted approximately 2 weeks.  This was discussed with her OB/GYN who thought that the [place] may have made this period more severe.  The patient denies having any shortness of breath, chest pain, or swelling in the left leg.  A full 10 point ROS is listed below.  MEDICAL HISTORY:  Past Medical History:  Diagnosis Date  . DVT (deep venous thrombosis)  ([place]) 04/06/2020    SURGICAL HISTORY: Past Surgical History:  Procedure Laterality Date  . KNEE ARTHROSCOPY Right 04/01/2019    ALLERGIES:  has No Known Allergies.  MEDICATIONS:  Current Outpatient Medications  Medication Sig Dispense Refill  . glucosamine-chondroitin 500-400 MG tablet Take 1 tablet by mouth once.    . rivaroxaban (XARELTO) 20 MG TABS tablet Take 1 tablet (20 mg total) by mouth daily with supper. 30 tablet 3   No current facility-administered medications for this visit.    REVIEW OF SYSTEMS:   Constitutional: ( - ) fevers, ( - )  chills , ( - ) night sweats Eyes: ( - ) blurriness of vision, ( - ) double vision, ( - ) watery eyes Ears, nose, mouth, throat, and face: ( - ) mucositis, ( - ) sore throat Respiratory: ( - ) cough, ( - ) dyspnea, ( - ) wheezes Cardiovascular: ( - ) palpitation, ( - ) chest discomfort, ( - ) lower extremity swelling Gastrointestinal:  ( - ) nausea, ( - ) heartburn, ( - ) change in bowel habits Skin: ( - ) abnormal skin rashes Lymphatics: ( - ) new lymphadenopathy, ( - ) easy bruising Neurological: ( - ) numbness, ( - ) tingling, ( - ) new weaknesses Behavioral/Psych: ( - ) mood change, ( - ) new changes  All other systems were reviewed with the patient and are negative.  PHYSICAL EXAMINATION: ECOG PERFORMANCE STATUS: 1 - Symptomatic but completely ambulatory  Vitals:   09/28/19 0805  BP: 117/74  Pulse: 77  Resp: 20  Temp: 97.8 F (36.6 C)  SpO2: 99%   Filed Weights   09/28/19 0805  Weight: 165 lb 6.4 oz (75 kg)    GENERAL: well appearing middle aged Caucasian female alert, no distress and comfortable SKIN: skin color, texture, turgor are normal, no rashes or significant lesions EYES: conjunctiva are pink and non-injected, sclera clear Musculoskeletal: no cyanosis of digits and no clubbing. No erythema of the LE or edema noted.  PSYCH: alert & oriented x 3, fluent speech NEURO: no focal motor/sensory  deficits  LABORATORY DATA:  I have reviewed the data as listed CBC Latest Ref Rng & Units 09/28/2019 06/15/2019 08/11/2014  WBC 4.0 - 10.5 K/uL 8.0 6.8 11.6(H)  Hemoglobin 12.0 - 15.0 g/dL 09.8 11.9 14.7  Hematocrit 36.0 - 46.0 % 39.6 41.6 36.9  Platelets 150 - 400 K/uL 211 227 204    CMP Latest Ref Rng & Units 09/28/2019 06/15/2019 08/11/2014  Glucose 70 - 99 mg/dL 99 97 829(F)  BUN 6 - 20 mg/dL 14 10 13   Creatinine 0.44 - 1.00 mg/dL 6.21 3.08  Sodium 135 - 145 mmol/L 139 140 134(L)  Potassium 3.5 - 5.1 mmol/L 4.1 4.6 3.7  Chloride 98 - 111 mmol/L 106 105 103  CO2 22 - 32 mmol/L 23 24 26   Calcium 8.9 - 10.3 mg/dL 6.57) 9.1 8.6  Total Protein 6.5 - 8.1 g/dL 6.9 7.4 -  Total Bilirubin 0.3 - 1.2 mg/dL 0.4 0.6 -  Alkaline Phos 38 - 126 U/L 69 75 -  AST 15 - 41 U/L 17 18 -  ALT 0 - 44 U/L 13 21 -    RADIOGRAPHIC STUDIES: None relevant to review.  No results found.  ASSESSMENT & PLAN Deborah Burton 49 y.o. female with medical history significant for right lower extremity DVT presents for a follow up visit.  After review the labs and discussion with the patient it is apparent that her right lower extremity symptoms have not changed since her last visit 3 months ago.  She has been on full-strength anticoagulation therapy with Xarelto and unfortunately has had heavy menstrual cycles as a result of this treatment.  The digital tightness the patient is experiencing her calf may be secondary to post thrombotic syndrome.  Would recommend exercise, compression stocking, and NSAID therapy to help alleviate this.  I do not think it is necessary to continue full-strength anticoagulation at this time.  Additionally as her symptoms have not worsened and she has no swelling I would recommend that we continue to monitor clinically.  If the patient were to develop worsening pain, tightness, or swelling we can consider a repeat ultrasound.  Furthermore if the patient were to develop any symptoms of  shortness of breath or chest pain I would recommend we perform a CT PE study.  #Right Lower Extremity DVT, provoked (post surgical) --patient completed a full course of 6 months of Xarelto therapy. 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 Treatment of choice would be continued exercise, NSAID therapy, and consideration of a compression stocking. --no clear indication for repeat imaging at this time. Lower extremity is without swelling or overlying erythema. If pain worsens we can consider repeat Arman Bogus --recommend she contact our service prior to any planned surgery to assist with thromboprophylaxis.  --RTC PRN. Strict return precautions for new leg swelling, shortness  of breath, or chest pain.    #Heavy Menstrual Bleeding --noted to have a heavy period last month that lasted 2 weeks --thought to be worsened 2/2 to Xarelto  --Hgb within normal limits today  --continue to monitor   All questions were answered. The patient knows to call the clinic with any problems, questions or concerns.  A total of more than 20 minutes were spent on this encounter and over half of that time was spent on counseling and coordination of care as outlined above.   Ulysees Barns, MD Department of Hematology/Oncology [facility] at [facility] Phone: [phone] Pager: [phone] Email: [email or web address]  09/28/2019 10:36 AM

## 2019-09-28 ENCOUNTER — Inpatient Hospital Stay: Payer: BC Managed Care – PPO | Attending: Hematology and Oncology

## 2019-09-28 ENCOUNTER — Other Ambulatory Visit: Payer: Self-pay

## 2019-09-28 ENCOUNTER — Encounter: Payer: Self-pay | Admitting: Hematology and Oncology

## 2019-09-28 ENCOUNTER — Inpatient Hospital Stay: Payer: BC Managed Care – PPO | Admitting: Hematology and Oncology

## 2019-09-28 VITALS — BP 117/74 | HR 77 | Temp 97.8°F | Resp 20 | Ht 63.0 in | Wt 165.4 lb

## 2019-09-28 DIAGNOSIS — Z7901 Long term (current) use of anticoagulants: Secondary | ICD-10-CM | POA: Insufficient documentation

## 2019-09-28 DIAGNOSIS — I82461 Acute embolism and thrombosis of right calf muscular vein: Secondary | ICD-10-CM | POA: Diagnosis not present

## 2019-09-28 DIAGNOSIS — N920 Excessive and frequent menstruation with regular cycle: Secondary | ICD-10-CM | POA: Insufficient documentation

## 2019-09-28 LAB — CBC WITH DIFFERENTIAL (CANCER CENTER ONLY)
Abs Immature Granulocytes: 0.01 10*3/uL (ref 0.00–0.07)
Basophils Absolute: 0 10*3/uL (ref 0.0–0.1)
Basophils Relative: 0 %
Eosinophils Absolute: 0.2 10*3/uL (ref 0.0–0.5)
Eosinophils Relative: 2 %
HCT: 39.6 % (ref 36.0–46.0)
Hemoglobin: 13.2 g/dL (ref 12.0–15.0)
Immature Granulocytes: 0 %
Lymphocytes Relative: 27 %
Lymphs Abs: 2.2 10*3/uL (ref 0.7–4.0)
MCH: 29.5 pg (ref 26.0–34.0)
MCHC: 33.3 g/dL (ref 30.0–36.0)
MCV: 88.4 fL (ref 80.0–100.0)
Monocytes Absolute: 0.6 10*3/uL (ref 0.1–1.0)
Monocytes Relative: 8 %
Neutro Abs: 5.1 10*3/uL (ref 1.7–7.7)
Neutrophils Relative %: 63 %
Platelet Count: 211 10*3/uL (ref 150–400)
RBC: 4.48 MIL/uL (ref 3.87–5.11)
RDW: 13.1 % (ref 11.5–15.5)
WBC Count: 8 10*3/uL (ref 4.0–10.5)
nRBC: 0 % (ref 0.0–0.2)

## 2019-09-28 LAB — CMP (CANCER CENTER ONLY)
ALT: 13 U/L (ref 0–44)
AST: 17 U/L (ref 15–41)
Albumin: 4 g/dL (ref 3.5–5.0)
Alkaline Phosphatase: 69 U/L (ref 38–126)
Anion gap: 10 (ref 5–15)
BUN: 14 mg/dL (ref 6–20)
CO2: 23 mmol/L (ref 22–32)
Calcium: 8.6 mg/dL — ABNORMAL LOW (ref 8.9–10.3)
Chloride: 106 mmol/L (ref 98–111)
Creatinine: 0.81 mg/dL (ref 0.44–1.00)
GFR, Est AFR Am: >60 mL/min (ref >60)
GFR, Estimated: >60 mL/min (ref >60)
Glucose, Bld: 99 mg/dL (ref 70–99)
Potassium: 4.1 mmol/L (ref 3.5–5.1)
Sodium: 139 mmol/L (ref 135–145)
Total Bilirubin: 0.4 mg/dL (ref 0.3–1.2)
Total Protein: 6.9 g/dL (ref 6.5–8.1)

## 2019-10-10 ENCOUNTER — Other Ambulatory Visit: Payer: Self-pay

## 2019-10-10 ENCOUNTER — Emergency Department (HOSPITAL_COMMUNITY)
Admission: EM | Admit: 2019-10-10 | Discharge: 2019-10-10 | Disposition: A | Discharge status: Home / Self Care | Payer: BC Managed Care – PPO | Attending: Emergency Medicine | Admitting: Emergency Medicine

## 2019-10-10 ENCOUNTER — Encounter (HOSPITAL_COMMUNITY): Payer: Self-pay | Admitting: Emergency Medicine

## 2019-10-10 ENCOUNTER — Emergency Department (HOSPITAL_COMMUNITY): Payer: BC Managed Care – PPO

## 2019-10-10 DIAGNOSIS — Z79899 Other long term (current) drug therapy: Secondary | ICD-10-CM | POA: Diagnosis not present

## 2019-10-10 DIAGNOSIS — R55 Syncope and collapse: Secondary | ICD-10-CM

## 2019-10-10 DIAGNOSIS — Z86718 Personal history of other venous thrombosis and embolism: Secondary | ICD-10-CM | POA: Diagnosis not present

## 2019-10-10 DIAGNOSIS — Z87891 Personal history of nicotine dependence: Secondary | ICD-10-CM | POA: Diagnosis not present

## 2019-10-10 DIAGNOSIS — E876 Hypokalemia: Secondary | ICD-10-CM | POA: Insufficient documentation

## 2019-10-10 LAB — URINALYSIS, ROUTINE W REFLEX MICROSCOPIC
Bilirubin Urine: NEGATIVE
Glucose, UA: NEGATIVE mg/dL
Hgb urine dipstick: NEGATIVE
Ketones, ur: NEGATIVE mg/dL
Leukocytes,Ua: NEGATIVE
Nitrite: NEGATIVE
Protein, ur: NEGATIVE mg/dL
Specific Gravity, Urine: 1.019 (ref 1.005–1.030)
pH: 5 (ref 5.0–8.0)

## 2019-10-10 LAB — BASIC METABOLIC PANEL
Anion gap: 10 (ref 5–15)
BUN: 12 mg/dL (ref 6–20)
CO2: 25 mmol/L (ref 22–32)
Calcium: 9.4 mg/dL (ref 8.9–10.3)
Chloride: 102 mmol/L (ref 98–111)
Creatinine, Ser: 0.81 mg/dL (ref 0.44–1.00)
GFR calc Af Amer: >60 mL/min (ref >60)
GFR calc non Af Amer: >60 mL/min (ref >60)
Glucose, Bld: 105 mg/dL — ABNORMAL HIGH (ref 70–99)
Potassium: 3.4 mmol/L — ABNORMAL LOW (ref 3.5–5.1)
Sodium: 137 mmol/L (ref 135–145)

## 2019-10-10 LAB — CBC
HCT: 38.4 % (ref 36.0–46.0)
Hemoglobin: 12.8 g/dL (ref 12.0–15.0)
MCH: 29.8 pg (ref 26.0–34.0)
MCHC: 33.3 g/dL (ref 30.0–36.0)
MCV: 89.5 fL (ref 80.0–100.0)
Platelets: 225 10*3/uL (ref 150–400)
RBC: 4.29 MIL/uL (ref 3.87–5.11)
RDW: 13.3 % (ref 11.5–15.5)
WBC: 10 10*3/uL (ref 4.0–10.5)
nRBC: 0 % (ref 0.0–0.2)

## 2019-10-10 LAB — I-STAT BETA HCG BLOOD, ED (MC, WL, AP ONLY): I-stat hCG, quantitative: <5 m[IU]/mL (ref <5)

## 2019-10-10 MED ORDER — POTASSIUM CHLORIDE CRYS ER 20 MEQ PO TBCR
40.0000 meq | EXTENDED_RELEASE_TABLET | Freq: Once | ORAL | Status: AC
  Administered 2019-10-10: 40 meq via ORAL
  Filled 2019-10-10: qty 2

## 2019-10-10 MED ORDER — SODIUM CHLORIDE 0.9% FLUSH: 3.0000 mL | Freq: Once | INTRAVENOUS | Status: DC

## 2019-10-10 MED ORDER — IOHEXOL 350 MG/ML SOLN
57.0000 mL | Freq: Once | INTRAVENOUS | Status: AC | PRN
  Administered 2019-10-10: 57 mL via INTRAVENOUS

## 2019-10-10 NOTE — ED Provider Notes (Signed)
[facility] EMERGENCY DEPARTMENT Provider Note   CSN: [id number] Arrival date & time: 10/10/19  8101   History Chief Complaint  Patient presents with  . Loss of Consciousness    Deborah Burton is a 49 y.o. female.  The history is provided by the patient.  Loss of Consciousness She has history of DVT and was taken off of rivaroxaban about 10 days ago and comes in after having had a syncopal episode at home tonight.  She states that she got up and walked to the kitchen and started to feel lightheaded.  She went to sit down but then had a syncopal episode.  There was mild nausea.  There was no chest pain, heaviness, tightness, pressure.  There were no palpitations, although she states that her Apple Watch showed her heart rate was on the slow side for her.  She denies dyspnea or diaphoresis.  She did have a previous episode of syncope which was somewhat similar, but she was diaphoretic without episode.  She had been on rivaroxaban for 6 months for DVT which occurred following the surgery.  Past Medical History:  Diagnosis Date  . DVT (deep venous thrombosis) (HCC) 04/06/2020    There are no problems to display for this patient.   Past Surgical History:  Procedure Laterality Date  . KNEE ARTHROSCOPY Right 04/01/2019     OB History   No obstetric history on file.     Family History  Problem Relation Age of Onset  . Deep vein thrombosis Mother   . Alzheimer's disease Father   . Breast cancer Paternal Aunt   . Prostate cancer Paternal Grandfather     Social History   Tobacco Use  . Smoking status: Former Smoker    Packs/day: 1.00    Years: 8.00    Pack years: 8.00    Types: Cigarettes  . Smokeless tobacco: Never Used  Substance Use Topics  . Alcohol use: Yes    Comment: rare  . Drug use: No    Home Medications Prior to Admission medications   Medication Sig Start Date End Date Taking? Authorizing Provider  glucosamine-chondroitin 500-400 MG  tablet Take 1 tablet by mouth once.    [provider]  rivaroxaban (XARELTO) 20 MG TABS tablet Take 1 tablet (20 mg total) by mouth daily with supper. 06/15/19   Jaci Standard, MD    Allergies    Patient has no known allergies.  Review of Systems   Review of Systems  Cardiovascular: Positive for syncope.  All other systems reviewed and are negative.   Physical Exam Updated Vital Signs BP 100/70 (BP Location: Left Arm)   Pulse 96   Temp 98.2 F (36.8 C) (Oral)   Resp 16   Ht 5\' 3"  (1.6 m)   Wt 75 kg   LMP 10/09/2019   SpO2 98%   BMI 29.29 kg/m   Physical Exam Vitals and nursing note reviewed.   49 year old female, resting comfortably and in no acute distress. Vital signs are normal. Oxygen saturation is 98%, which is normal. Head is normocephalic and atraumatic. PERRLA, EOMI. Oropharynx is clear. Neck is nontender and supple without adenopathy or JVD. Back is nontender and there is no CVA tenderness. Lungs are clear without rales, wheezes, or rhonchi. Chest is nontender. Heart has regular rate and rhythm without murmur. Abdomen is soft, flat, nontender without masses or hepatosplenomegaly and peristalsis is normoactive. Extremities have no cyanosis or edema, full range of motion  is present. Skin is warm and dry without rash. Neurologic: Mental status is normal, cranial nerves are intact, there are no motor or sensory deficits.  ED Results / Procedures / Treatments   Labs (all labs ordered are listed, but only abnormal results are displayed) Labs Reviewed  BASIC METABOLIC PANEL - Abnormal; Notable for the following components:      Result Value   Potassium 3.4 (*)    Glucose, Bld 105 (*)    All other components within normal limits  CBC  URINALYSIS, ROUTINE W REFLEX MICROSCOPIC  I-STAT BETA HCG BLOOD, ED ([facility], [facility], [facility] ONLY)  CBG MONITORING, ED    EKG EKG Interpretation  Date/Time:  Saturday Oct 10 2019 01:21:21 EDT Ventricular Rate:  86 PR  Interval:  138 QRS Duration: 66 QT Interval:  372 QTC Calculation: 445 R Axis:   38 Text Interpretation: Normal sinus rhythm Normal ECG When compared with ECG of 08/11/2014, No significant change was found Confirmed by Delora Fuel ([id number]) on 10/10/2019 4:43:55 AM   Radiology No results found.  Procedures Procedures   Medications Ordered in ED Medications  sodium chloride flush (NS) 0.9 % injection 3 mL (has no administration in time range)    ED Course  I have reviewed the triage vital signs and the nursing notes.  Pertinent labs & imaging results that were available during my care of the patient were reviewed by me and considered in my medical decision making (see chart for details).  MDM Rules/Calculators/A&P Syncope which sounds most like orthostatic syncope.  However, given her recent discontinuation of anticoagulation, will send for CT angiogram of the chest to rule out pulmonary embolism.  ECG was normal and labs were significant only for borderline low potassium.  She is given a dose of oral potassium.  Old records are reviewed confirming recent discontinuation of rivaroxaban following 6 months treatment for provoked DVT.  CT angiogram has been completed and I do not see any obvious evidence of pulmonary emboli, but final report is pending.  Case is signed out to Dr. Jeanell Sparrow.  Final Clinical Impression(s) / ED Diagnoses Final diagnoses:  Syncope, unspecified syncope type  Hypokalemia    Rx / DC Orders ED Discharge Orders    None       Delora Fuel, MD 92/11/94 [phone]

## 2019-10-10 NOTE — ED Triage Notes (Signed)
Patient reports brief syncopal episode at home this evening , alert and oriented at arrival , respirations unlabored /denies pain , speech clear with no facial asymmetry , equal grips with no arm drift .

## 2019-10-10 NOTE — Discharge Instructions (Addendum)
Your potassium was slightly low.  Please eat potassium rich foods and have level rechecked with your doctor this week.

## 2019-10-10 NOTE — ED Provider Notes (Signed)
  Physical Exam  BP 100/70 (BP Location: Left Arm)   Pulse 96   Temp 98.2 F (36.8 C) (Oral)   Resp 16   Ht 1.6 m (5\' 3" )   Wt 75 kg   LMP 10/09/2019   SpO2 98%   BMI 29.29 kg/m   Physical Exam  ED Course/Procedures     Procedures  MDM  49 year old female with episode ofsyncope, recently discontinued rivoraxaban for dvt.  Patient had a syncopal episode.  Patient having cta and may be discharged if negative.      44, MD 10/10/19 1504

## 2019-10-10 NOTE — ED Notes (Signed)
Patient verbalizes understanding of discharge instructions. Opportunity for questioning and answers were provided. Armband removed by staff, pt discharged from ED.  

## 2020-12-19 IMAGING — CT CT ANGIO CHEST
2 of 6 series · 19 of 36 positions shown · IV contrast (omnipaque)
Comparison: None.

CLINICAL DATA: Syncopal episode with recent DVT

EXAM:
CT ANGIOGRAPHY CHEST WITH CONTRAST
TECHNIQUE: Multidetector CT imaging of the chest was performed using the
standard protocol during bolus administration of intravenous
contrast. Multiplanar CT image reconstructions and MIPs were
obtained to evaluate the vascular anatomy.
CONTRAST:  57mL OMNIPAQUE IOHEXOL 350 MG/ML SOLN

[Series 7: pe thins · axial · 0.65mm/px · z∈[+1232,+1451]mm · 18 of 349 slices shown]
[im 18/349  lung]
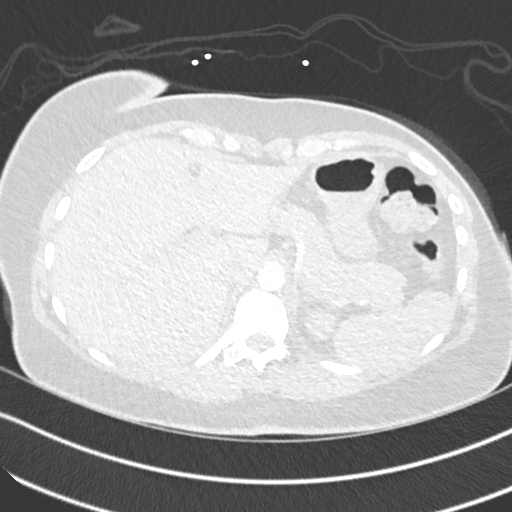
[im 35/349  mediastinal]
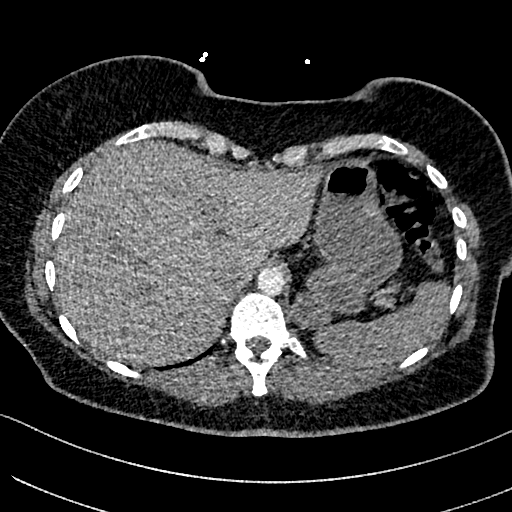
[im 53/349  lung]
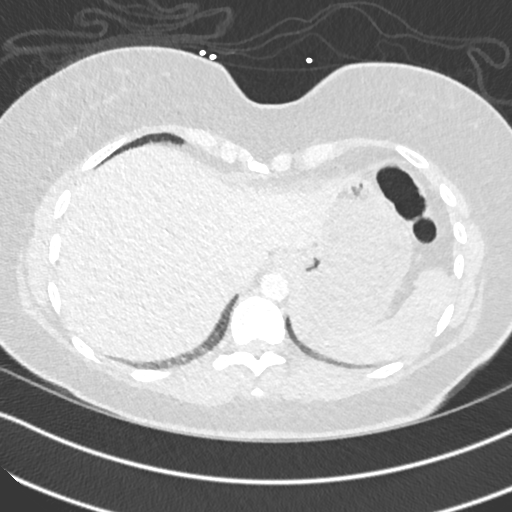
[im 70/349  mediastinal]
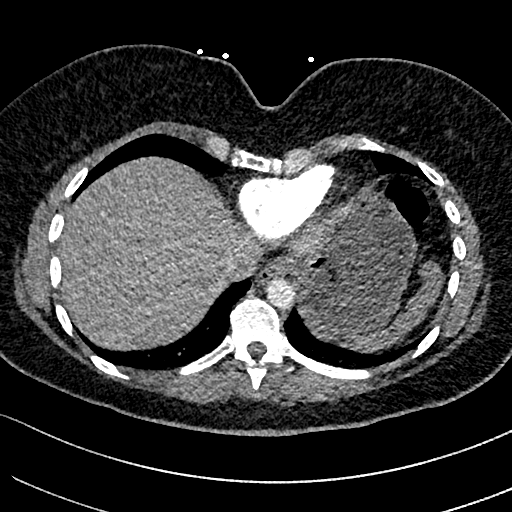
[im 88/349  lung]
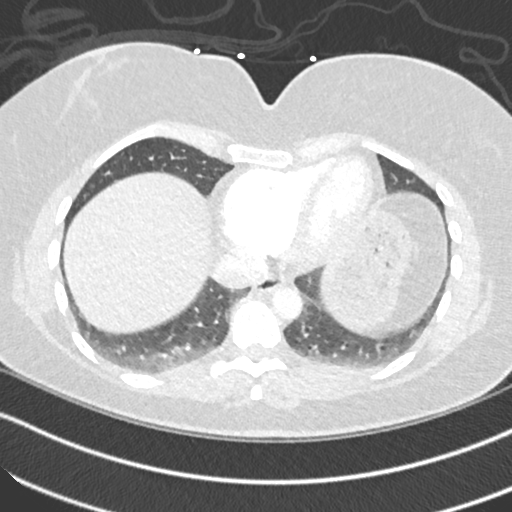
[im 105/349  mediastinal]
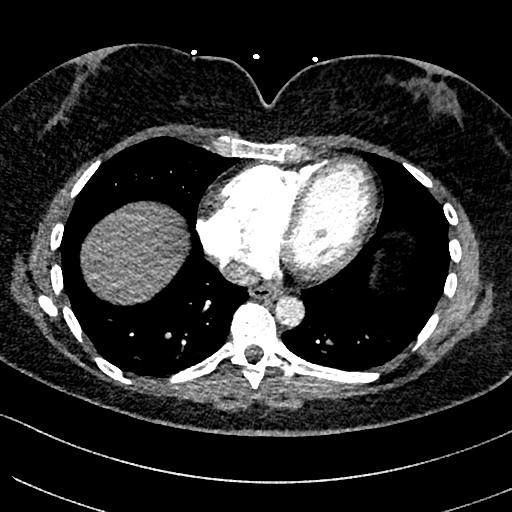
[im 122/349  lung]
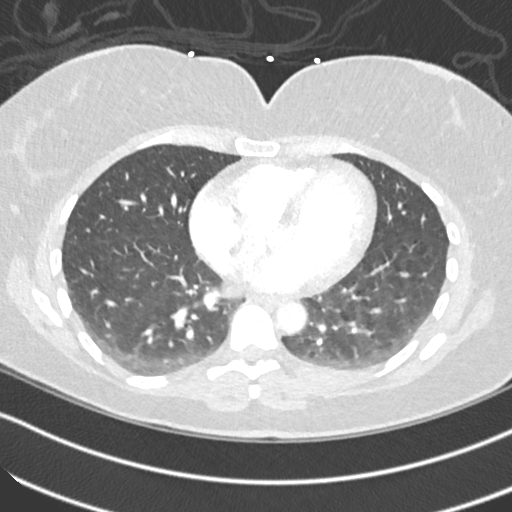
[im 140/349  mediastinal]
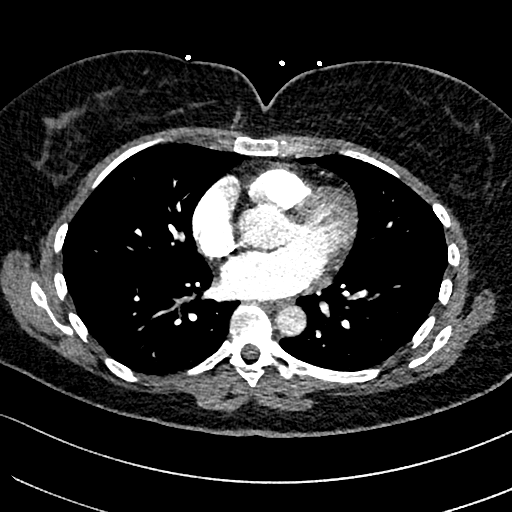
[im 157/349  lung]
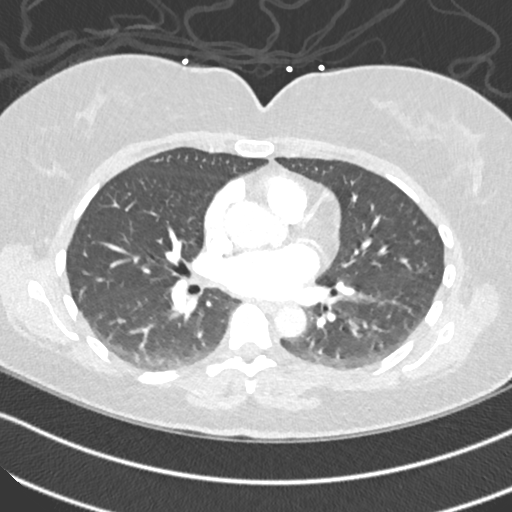
[im 192/349  mediastinal]
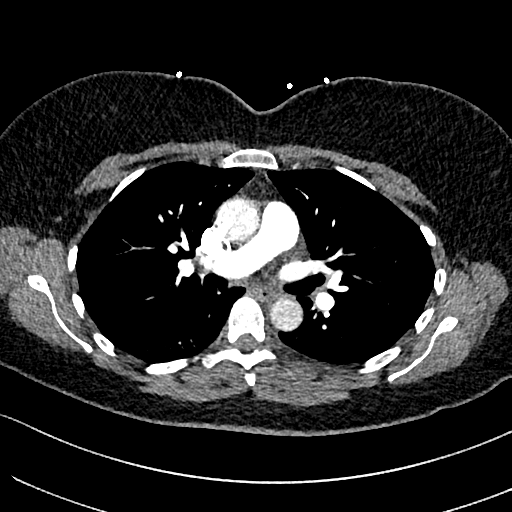
[im 209/349  lung]
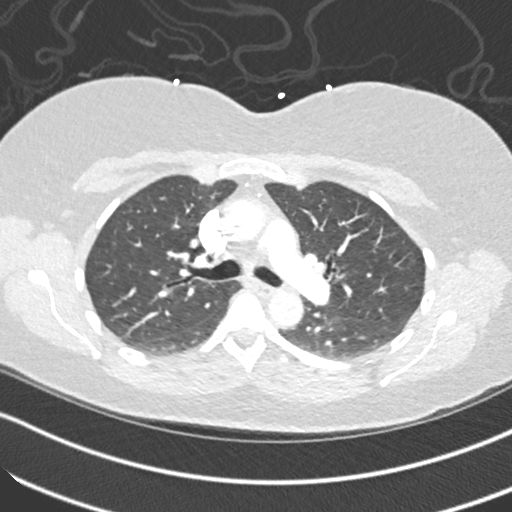
[im 227/349  mediastinal]
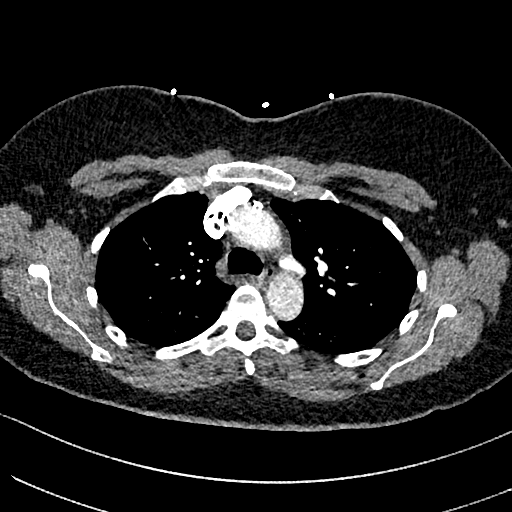
[im 244/349  lung]
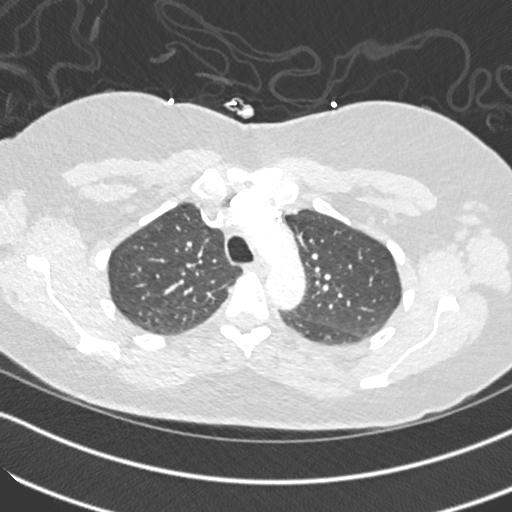
[im 262/349  mediastinal]
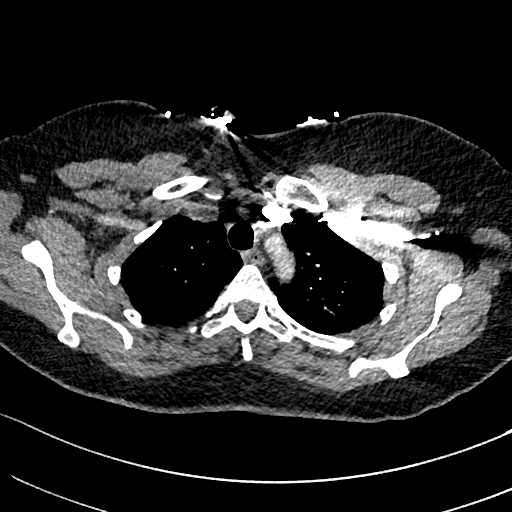
[im 279/349  lung]
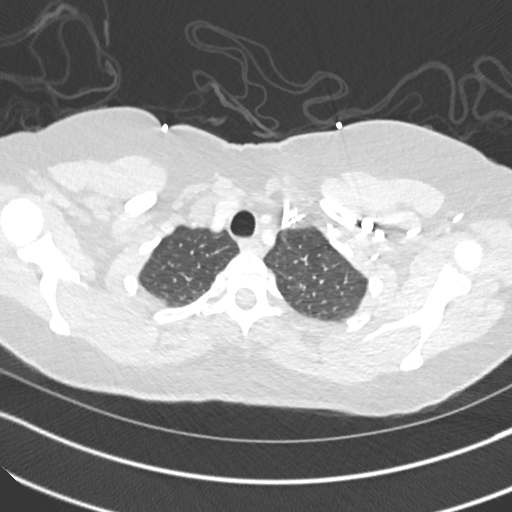
[im 296/349  mediastinal]
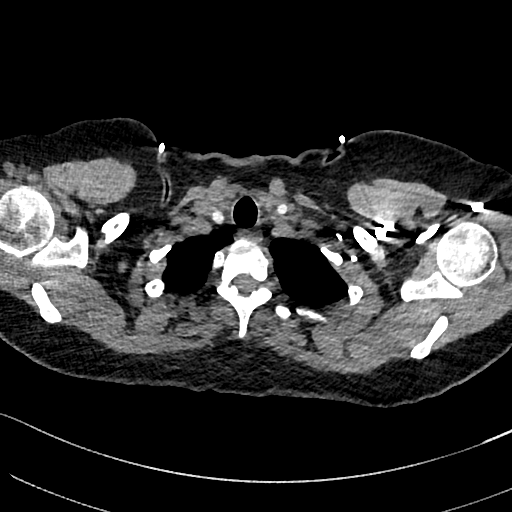
[im 314/349  lung]
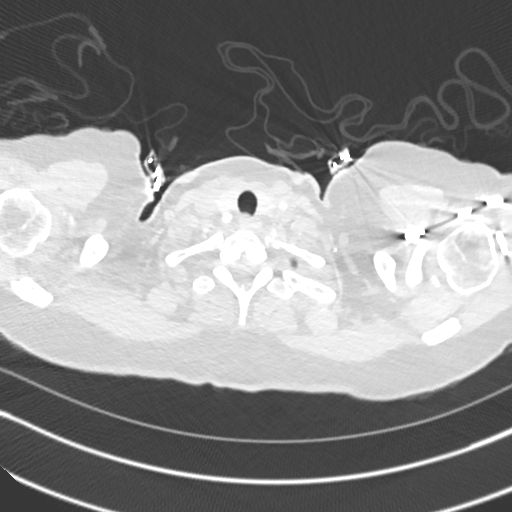
[im 331/349  mediastinal]
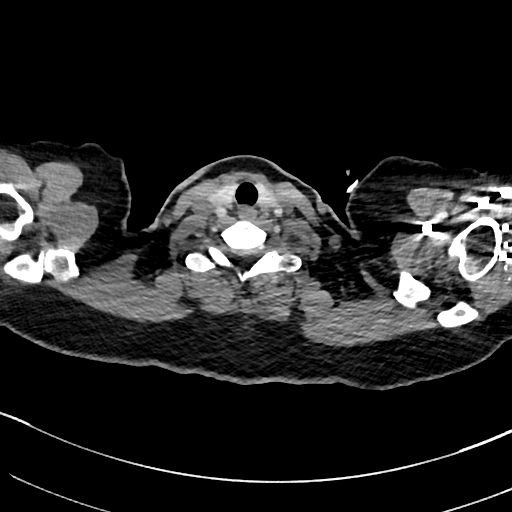

[Series 8: pe 2mm cor · coronal · 0.51mm/px · 1 of 94 slices shown]
[im 47/94  mediastinal]
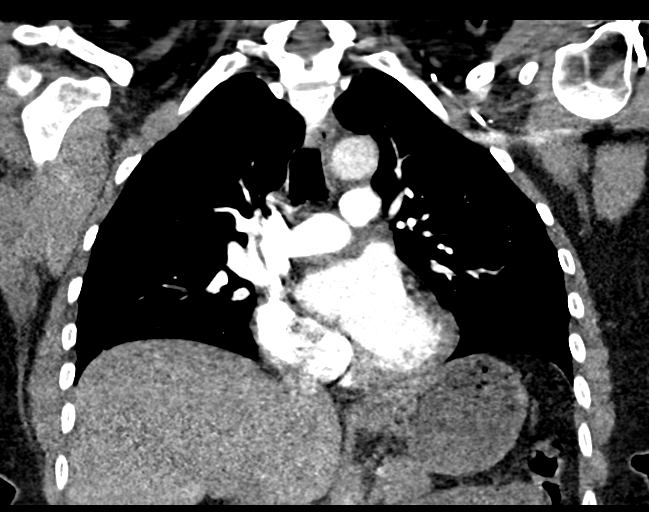

[19 of 36 positions shown; findings below may reference images not displayed]

FINDINGS: Cardiovascular: Satisfactory opacification of the pulmonary arteries
to the segmental level. No evidence of pulmonary embolism. Normal
heart size. No pericardial effusion.

Mediastinum/Nodes: No enlarged lymph nodes. Visualized thyroid is
unremarkable. Esophagus is unremarkable.

Lungs/Pleura: There is no consolidation or mass. No pleural effusion
or pneumothorax.

Upper Abdomen: No acute abnormality.

Musculoskeletal: No acute osseous abnormality.

Review of the MIP images confirms the above findings.
IMPRESSION: No evidence of acute pulmonary embolism or other acute abnormality.
